# Patient Record
Sex: Female | Born: 1955 | Race: White | Hispanic: No | Marital: Single | Smoking: Never smoker
Health system: Southern US, Community
[De-identification: ages and names within clinical notes are randomized; demographics above are authoritative.]

## PROBLEM LIST (undated history)

## (undated) DIAGNOSIS — F419 Anxiety disorder, unspecified: Secondary | ICD-10-CM

## (undated) DIAGNOSIS — R05 Cough: Secondary | ICD-10-CM

## (undated) DIAGNOSIS — F909 Attention-deficit hyperactivity disorder, unspecified type: Secondary | ICD-10-CM

## (undated) DIAGNOSIS — Z9289 Personal history of other medical treatment: Secondary | ICD-10-CM

## (undated) DIAGNOSIS — J189 Pneumonia, unspecified organism: Secondary | ICD-10-CM

## (undated) DIAGNOSIS — K589 Irritable bowel syndrome without diarrhea: Secondary | ICD-10-CM

## (undated) DIAGNOSIS — R059 Cough, unspecified: Secondary | ICD-10-CM

## (undated) DIAGNOSIS — D649 Anemia, unspecified: Secondary | ICD-10-CM

## (undated) DIAGNOSIS — T839XXA Unspecified complication of genitourinary prosthetic device, implant and graft, initial encounter: Secondary | ICD-10-CM

## (undated) HISTORY — DX: Irritable bowel syndrome, unspecified: K58.9

## (undated) HISTORY — DX: Unspecified complication of genitourinary prosthetic device, implant and graft, initial encounter: T83.9XXA

## (undated) HISTORY — PX: LIPOMA EXCISION: SHX5283

## (undated) HISTORY — DX: Attention-deficit hyperactivity disorder, unspecified type: F90.9

---

## 1999-08-08 DIAGNOSIS — Z9289 Personal history of other medical treatment: Secondary | ICD-10-CM

## 1999-08-08 HISTORY — DX: Personal history of other medical treatment: Z92.89

## 2001-02-12 ENCOUNTER — Emergency Department (HOSPITAL_COMMUNITY): Admission: EM | Admit: 2001-02-12 | Discharge: 2001-02-12 | Payer: Self-pay

## 2001-03-07 ENCOUNTER — Encounter (INDEPENDENT_AMBULATORY_CARE_PROVIDER_SITE_OTHER): Payer: Self-pay | Admitting: Specialist

## 2001-03-07 ENCOUNTER — Ambulatory Visit (HOSPITAL_BASED_OUTPATIENT_CLINIC_OR_DEPARTMENT_OTHER): Admission: RE | Admit: 2001-03-07 | Discharge: 2001-03-07 | Payer: Self-pay | Admitting: Surgery

## 2001-04-30 ENCOUNTER — Ambulatory Visit (HOSPITAL_COMMUNITY): Admission: RE | Admit: 2001-04-30 | Discharge: 2001-05-01 | Payer: Self-pay | Admitting: Surgery

## 2003-07-18 ENCOUNTER — Inpatient Hospital Stay (HOSPITAL_COMMUNITY): Admission: AD | Admit: 2003-07-18 | Discharge: 2003-07-18 | Payer: Self-pay | Admitting: *Deleted

## 2003-07-30 ENCOUNTER — Encounter: Admission: RE | Admit: 2003-07-30 | Discharge: 2003-07-30 | Payer: Self-pay | Admitting: Obstetrics and Gynecology

## 2004-05-21 IMAGING — US US PELVIS COMPLETE MODIFY
1 series · 18 of 25 positions shown · non-contrast
Comparison: None.

CLINICAL DATA: Perimenopausal patient with 3 week history of brisk vaginal bleeding and pelvic pain.
TRANSABDOMINAL AND TRANSVAGINAL PELVIC ULTRASOUND, 07/18/03

[Series 1: us transvaginal non-ob · 18 of 34 slices shown]
[im 1/34]
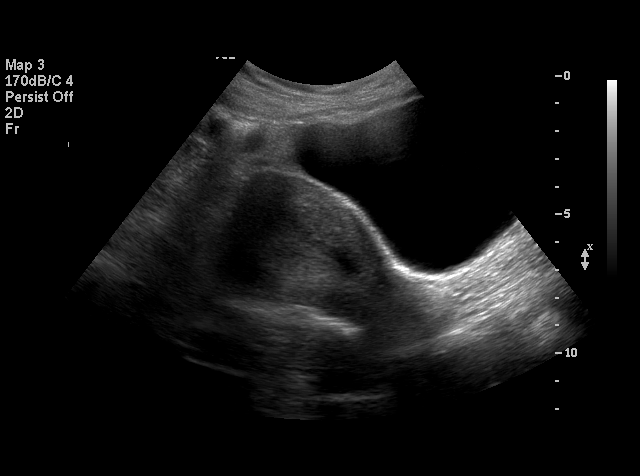
[im 3/34]
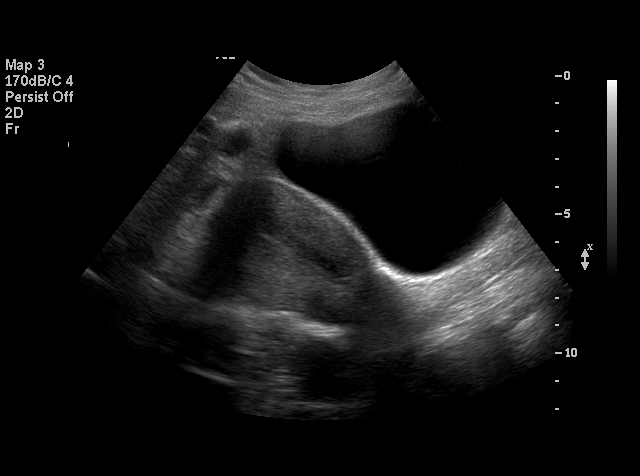
[im 5/34]
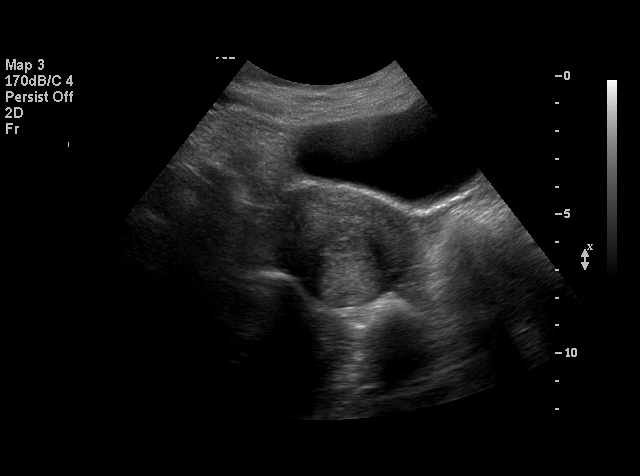
[im 6/34]
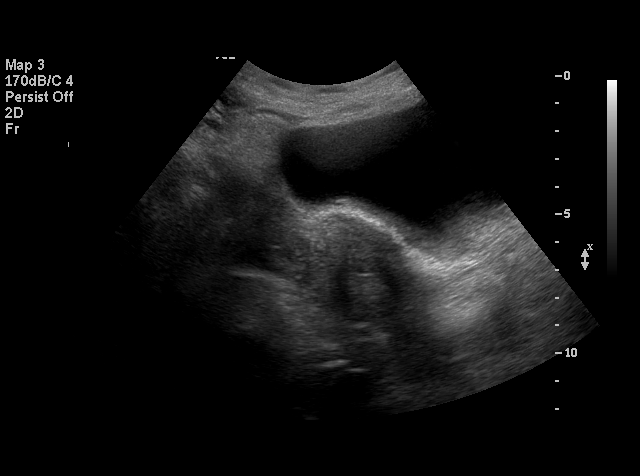
[im 9/34]
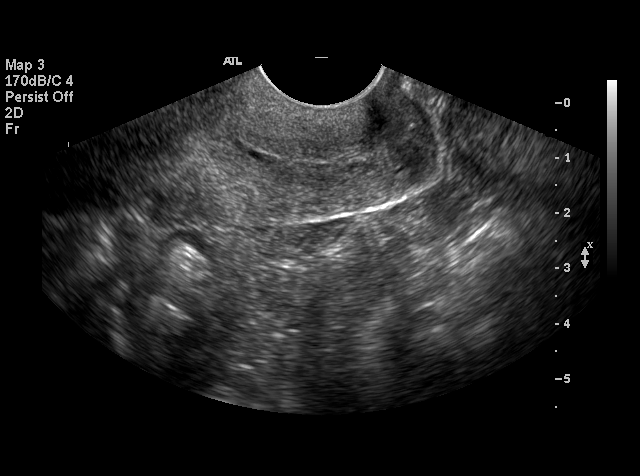
[im 10/34]
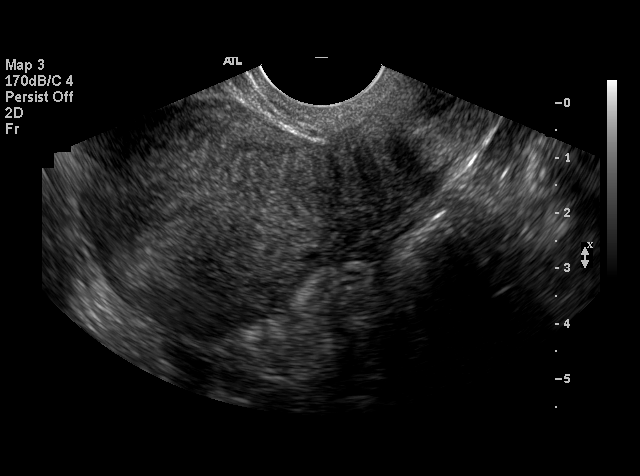
[im 13/34]
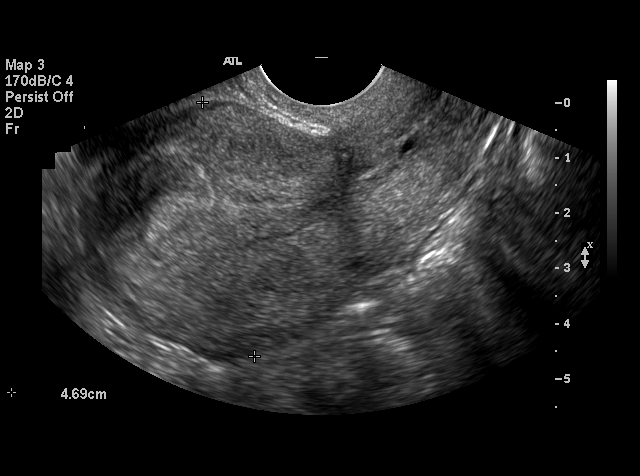
[im 14/34]
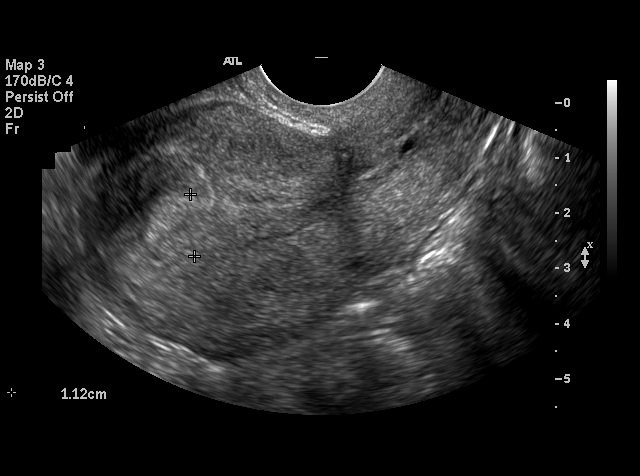
[im 16/34]
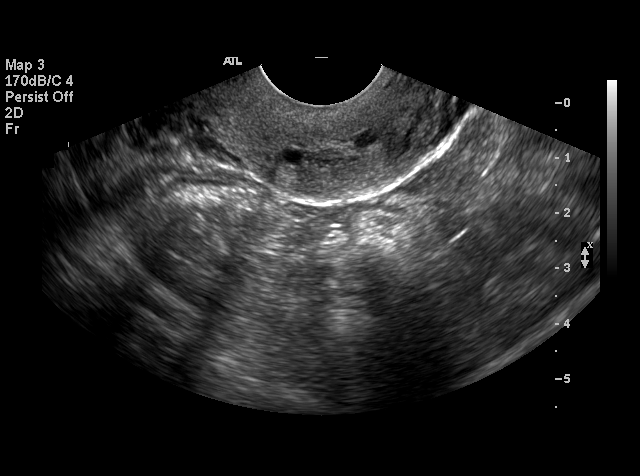
[im 18/34]
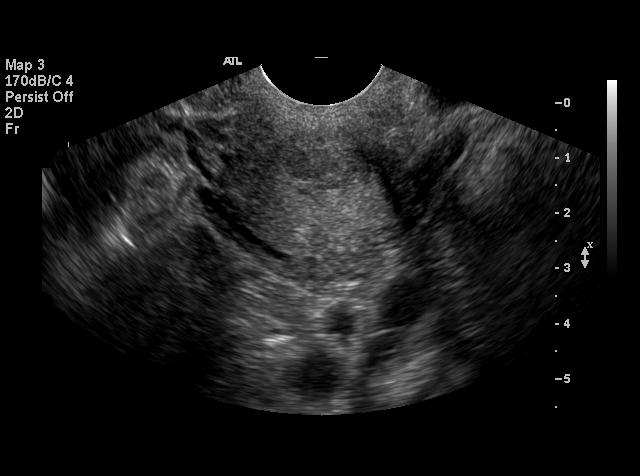
[im 20/34]
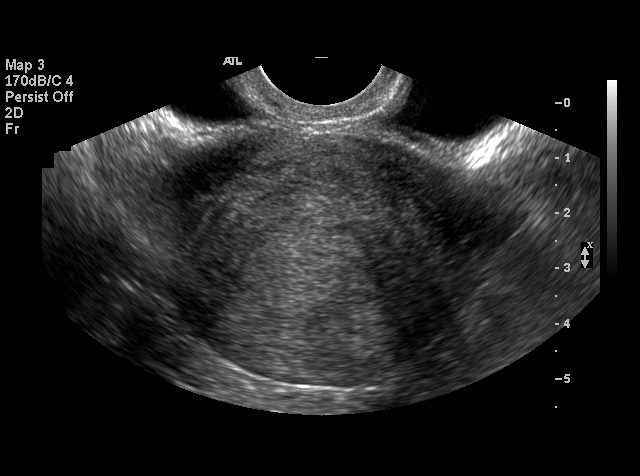
[im 21/34]
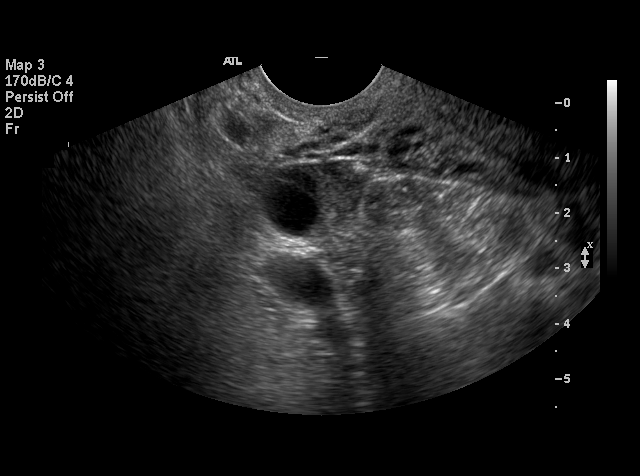
[im 24/34]
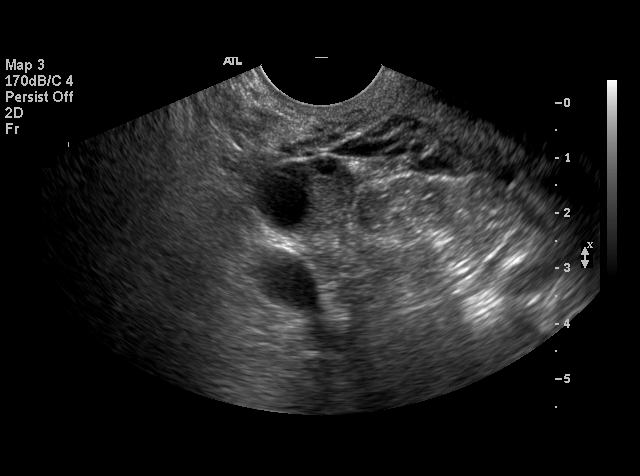
[im 25/34]
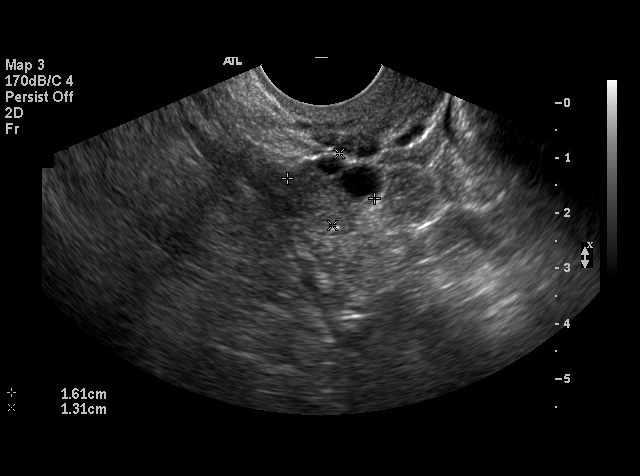
[im 28/34]
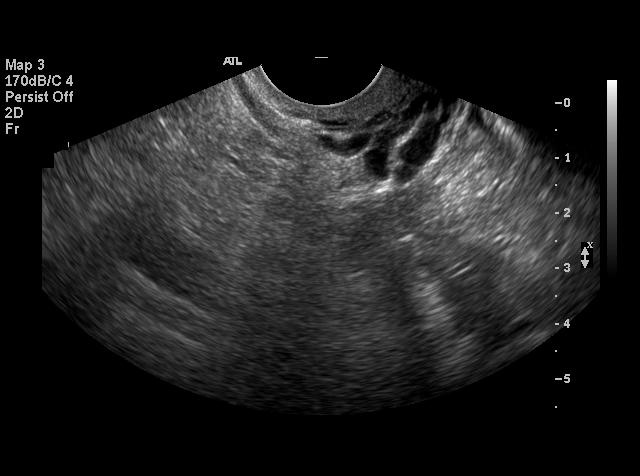
[im 29/34]
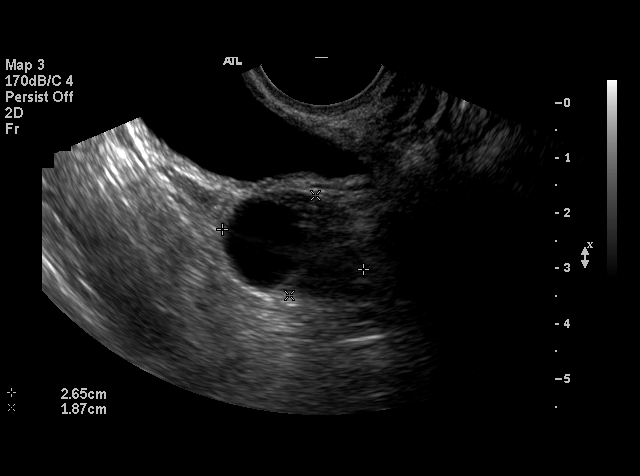
[im 31/34]
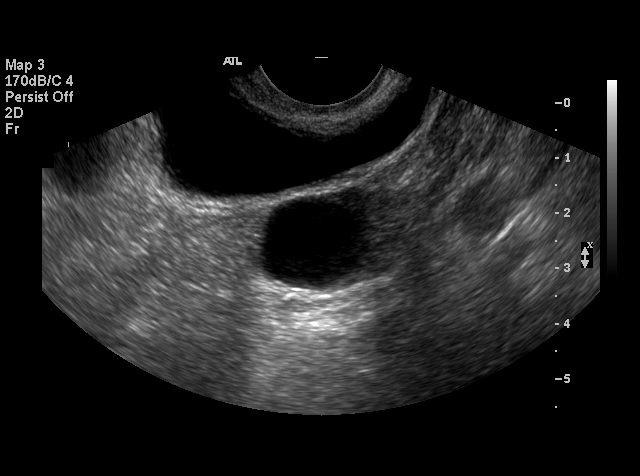
[im 34/34]
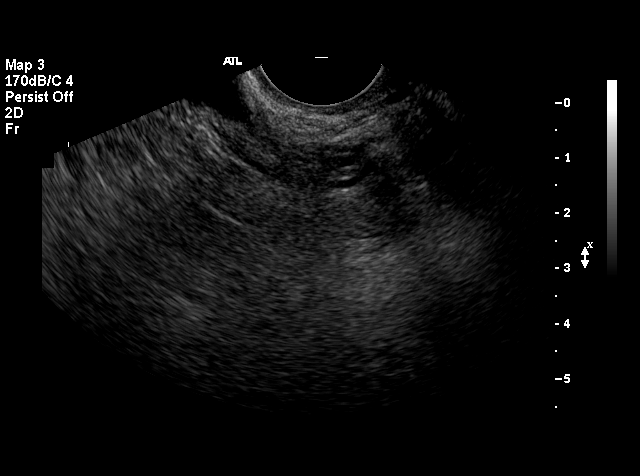

[18 of 25 positions shown; findings below may reference images not displayed]

Initially, transabdominal imaging was performed using the bladder as an acoustic window.  This shows a normal appearance to the uterus.  Thickening of the endometrium is noted.  No adnexal masses or free fluid were identified.
Subsequently, the bladder was emptied and transvaginal imaging was performed.  This confirms a normal-sized uterus which measures approximately 8.5 x 4.7 x 5.0 cm.  The endometrium is thickened, measuring approximately 13 mm.  No discrete endometrial polyps or masses are identified.  There are no fibroids and the uterine myometrium appears normal.
Both ovaries are normal in size, the right measuring approximately 1.8 x 1.6 x 1.3 cm.  The right ovary contains several small follicular cysts.  The left ovary measures approximately 2.7 x 1.9 x 2.6 cm and contains an approximate 1.7 cm simple cyst.  No adnexal masses or free fluid were identified.
IMPRESSION 
Thickened endometrium measuring 13 mm.  No discrete endometrial polyps or masses are identified.
Normal-appearing myometrium with no evidence of uterine fibroids.
1.7 cm simple cyst left ovary.  Small follicular cyst right ovary.
[REDACTED]

## 2004-08-07 DIAGNOSIS — J189 Pneumonia, unspecified organism: Secondary | ICD-10-CM

## 2004-08-07 HISTORY — DX: Pneumonia, unspecified organism: J18.9

## 2010-08-28 ENCOUNTER — Encounter: Payer: Self-pay | Admitting: Family Medicine

## 2010-12-23 NOTE — Group Therapy Note (Signed)
NAME:  Dorothy Harrison, Dorothy Harrison                             ACCOUNT NO.:  [id number]   MEDICAL RECORD NO.:  [id number]                   PATIENT TYPE:  OUT   LOCATION:  [facility]                           FACILITY:  [facility]   PHYSICIAN:  Argentina Donovan, MD                     DATE OF BIRTH:  27-Sep-1955   DATE OF SERVICE:  07/30/2003                                    CLINIC NOTE   REASON FOR VISIT:  The patient is a 55 year old gravida 4, para 4-0-0-4 who  went into the MAU because she had been bleeding for 21 days.  She was put on  oral contraceptives which has controlled the bleeding completely using Ovcon-  35.  She had a hemoglobin 9.1, hematocrit 26.7 at that time.  She has had no  further bleeding.  We have encouraged her to continue the pills for another  several months until her blood count gets back up to normal and then if she  would like to stop that should be fine.  She had a sonogram.  It was her  first bleeding episode such as that except for regular periods and the  sonogram showed a thickening endometrium, otherwise completely normal.  She  is a nonsmoker and has a normal blood pressure and weighs only 133 pounds.   IMPRESSION:  Dysfunctional uterine bleeding with secondary anemia.                                               Argentina Donovan, MD    PR/MEDQ  D:  07/30/2003  T:  07/30/2003  Job:  [id number]

## 2010-12-23 NOTE — Op Note (Signed)
[place]. [facility]  Patient:    Dorothy Harrison, Dorothy Harrison                            MRN: [id number] Proc. Date: 03/07/01 Adm. Date:  [id number] Disc. Date: [id number] Attending:  Earline Mayotte R                           Operative Report  PREOPERATIVE DIAGNOSIS:  A 2 cm scalp lesion.  POSTOPERATIVE DIAGNOSIS:  A 2 cm scalp lesion.  OPERATION PERFORMED:  Excision of scalp lesion.  SURGEON:  Abigail Miyamoto, M.D.  ANESTHESIA:  General and 0.25% Marcaine with epinephrine.  ESTIMATED BLOOD LOSS:  Minimal.  DESCRIPTION OF PROCEDURE:  The patient was brought to the operating room and identified as Dorothy Harrison.  She was placed supine on the operating table and general anesthesia was induced.  Her head was then prepped and draped in the usual sterile fashion.  A 2 cm scalp lesion on the top of her head was identified.  An elliptical incision was then created to completely excise this lesion.  Incision was carried down to the skull with the electrocautery.  The entire lesion was excised circumferentially and sent to pathology for identification.  Hemostasis was then achieved with electrocautery.  Skin flaps were then created on each side of the excision.  The subcutaneous layer was then reapproximated with interrupted 2-0 Vicryl sutures.  The skin was then closed with skin staples.  The patient tolerated the procedure well.  Sponge, needle and instrument counts were correct at the end of the procedure.  The patient was then extubated in the operating room and taken in stable condition to the recovery room. DD:  03/07/01 TD:  03/07/01 Job: [id number]

## 2010-12-23 NOTE — Op Note (Signed)
[place]. [facility]  Patient:    Dorothy Harrison, Dorothy Harrison Visit Number: [id number] MRN: [id number]          Service Type: DSU Location: [phone] Attending Physician:  Shelly Rubenstein Dictated by:   Abigail Miyamoto, M.D. Proc. Date: 04/30/01 Admit Date:  04/30/2001                             Operative Report  PREOPERATIVE DIAGNOSIS:  Chronic scalp wound.  POSTOPERATIVE DIAGNOSIS:  Chronic scalp wound.  OPERATION PERFORMED:  Debridement and attempted closure of chronic scalp wound.  SURGEON:  Abigail Miyamoto, M.D.  ANESTHESIA:  General endotracheal anesthesia, 0.25% Marcaine with epinephrine.  ESTIMATED BLOOD LOSS:  Minimal.  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  She had had infection of this wound and chronic drainage ever since then.  She has had difficulty taking care of this herself and would not allow any debridement or cleaning of the wound in the office.  She has not been cleaning it herself.  Therefore decision was made to examine the wound under anesthesia and potentially debride it.  FINDINGS:  The patient was found to have a large area of granulation tissue where the wound had broken down with an area of exposed scalp.  DESCRIPTION OF PROCEDURE:  Patient brought to operating room and identified as Dorothy Harrison.  She was placed supine on the operating table and general anesthesia was induced.  Her head was then prepped and draped in the usual sterile fashion.  The matted dried hair and eschar were then pulled off of the chronic wound.  The patient had a large 5 x 8 cm area of granulation tissue with also exposed skull.  At this point decision was made to widely excise this.  This was then in elliptical fashion with a #10 scalpel.  Incision was carried down to the skull.  At this point attempts were made to mobile  the scalp.  This was done as far anterior as the forehead and as far posterior as the back of the head.  Despite this I was unable to completely close the wound.  The wound was closed with horizontal mattress 0 Prolene sutures; however, there was still a quarter sized area of exposed skull that could not be closed over.  At this point a decision was made to end the procedure.  The wound was then covered with a Xeroform gauze and then dry gauze.  The patient tolerated the procedure well.  All sponge, needle and instrument counts were correct at the end of the procedure.  The patient was then extubated in the operating room and taken in stable condition to the recovery room. Dictated by:   Abigail Miyamoto, M.D. Attending Physician:  Shelly Rubenstein DD:  04/30/01 TD:  04/30/01 Job: [id number]

## 2011-12-23 ENCOUNTER — Other Ambulatory Visit: Payer: Self-pay | Admitting: Physician Assistant

## 2011-12-24 ENCOUNTER — Other Ambulatory Visit: Payer: Self-pay | Admitting: Physician Assistant

## 2012-01-11 ENCOUNTER — Other Ambulatory Visit: Payer: Self-pay

## 2012-02-06 ENCOUNTER — Encounter: Payer: Self-pay | Admitting: Internal Medicine

## 2012-02-28 ENCOUNTER — Encounter (HOSPITAL_COMMUNITY): Payer: Self-pay | Admitting: Pharmacy Technician

## 2012-02-29 ENCOUNTER — Encounter (HOSPITAL_COMMUNITY): Payer: Self-pay | Admitting: *Deleted

## 2012-03-01 ENCOUNTER — Encounter (HOSPITAL_COMMUNITY): Payer: Self-pay | Admitting: Anesthesiology

## 2012-03-01 ENCOUNTER — Encounter (HOSPITAL_COMMUNITY)
Admission: RE | Disposition: A | Discharge status: Home / Self Care | Payer: Self-pay | Source: Ambulatory Visit | Attending: Obstetrics and Gynecology

## 2012-03-01 ENCOUNTER — Ambulatory Visit (HOSPITAL_COMMUNITY)
Admission: RE | Admit: 2012-03-01 | Discharge: 2012-03-01 | Disposition: A | Discharge status: Home / Self Care | Payer: 59 | Source: Ambulatory Visit | Attending: Obstetrics and Gynecology | Admitting: Obstetrics and Gynecology

## 2012-03-01 DIAGNOSIS — Z538 Procedure and treatment not carried out for other reasons: Secondary | ICD-10-CM | POA: Insufficient documentation

## 2012-03-01 HISTORY — DX: Pneumonia, unspecified organism: J18.9

## 2012-03-01 HISTORY — DX: Cough, unspecified: R05.9

## 2012-03-01 HISTORY — DX: Personal history of other medical treatment: Z92.89

## 2012-03-01 HISTORY — DX: Anemia, unspecified: D64.9

## 2012-03-01 HISTORY — DX: Anxiety disorder, unspecified: F41.9

## 2012-03-01 HISTORY — DX: Cough: R05

## 2012-03-01 SURGERY — REMOVAL, INTRAUTERINE DEVICE
Anesthesia: Choice

## 2012-03-01 MED ORDER — FENTANYL CITRATE 0.05 MG/ML IJ SOLN
INTRAMUSCULAR | Status: AC
  Filled 2012-03-01: qty 2

## 2012-03-01 MED ORDER — MIDAZOLAM HCL 2 MG/2ML IJ SOLN
INTRAMUSCULAR | Status: AC
  Filled 2012-03-01: qty 2

## 2012-03-01 MED ORDER — PROPOFOL 10 MG/ML IV EMUL
INTRAVENOUS | Status: AC
  Filled 2012-03-01: qty 20

## 2012-03-01 MED ORDER — DEXAMETHASONE SODIUM PHOSPHATE 10 MG/ML IJ SOLN
INTRAMUSCULAR | Status: AC
  Filled 2012-03-01: qty 1

## 2012-03-01 MED ORDER — ONDANSETRON HCL 4 MG/2ML IJ SOLN
INTRAMUSCULAR | Status: AC
  Filled 2012-03-01: qty 2

## 2012-03-01 MED ORDER — LIDOCAINE HCL (CARDIAC) 20 MG/ML IV SOLN
INTRAVENOUS | Status: AC
  Filled 2012-03-01: qty 5

## 2012-03-01 SURGICAL SUPPLY — 12 items
CATH ROBINSON RED A/P 16FR (CATHETERS) IMPLANT
CLOTH BEACON ORANGE TIMEOUT ST (SAFETY) IMPLANT
CONTAINER PREFILL 10% NBF 60ML (FORM) IMPLANT
GLOVE BIO SURGEON STRL SZ7 (GLOVE) IMPLANT
GOWN PREVENTION PLUS LG XLONG (DISPOSABLE) IMPLANT
GOWN STRL REIN XL XLG (GOWN DISPOSABLE) IMPLANT
NEEDLE SPNL 22GX3.5 QUINCKE BK (NEEDLE) IMPLANT
PACK VAGINAL MINOR WOMEN LF (CUSTOM PROCEDURE TRAY) IMPLANT
PAD PREP 24X48 CUFFED NSTRL (MISCELLANEOUS) IMPLANT
SYR CONTROL 10ML LL (SYRINGE) IMPLANT
TOWEL OR 17X24 6PK STRL BLUE (TOWEL DISPOSABLE) IMPLANT
WATER STERILE IRR 1000ML POUR (IV SOLUTION) IMPLANT

## 2012-03-01 NOTE — Progress Notes (Signed)
Surgery cancelled.  Pt. D/c to home with family.  Understands instructions to reschedule procedure with Dr. Waynard Reeds.

## 2012-03-04 ENCOUNTER — Encounter (HOSPITAL_COMMUNITY): Payer: Self-pay | Admitting: Obstetrics and Gynecology

## 2013-06-10 ENCOUNTER — Ambulatory Visit: Payer: Self-pay | Admitting: Women's Health

## 2013-06-13 ENCOUNTER — Ambulatory Visit: Payer: Self-pay | Admitting: Women's Health

## 2013-06-19 ENCOUNTER — Encounter: Payer: Self-pay | Admitting: Women's Health

## 2013-06-19 ENCOUNTER — Ambulatory Visit (INDEPENDENT_AMBULATORY_CARE_PROVIDER_SITE_OTHER): Payer: BC Managed Care – PPO | Admitting: Women's Health

## 2013-06-19 VITALS — BP 118/70 | Ht 66.0 in | Wt 162.2 lb

## 2013-06-19 DIAGNOSIS — B9689 Other specified bacterial agents as the cause of diseases classified elsewhere: Secondary | ICD-10-CM

## 2013-06-19 DIAGNOSIS — T839XXA Unspecified complication of genitourinary prosthetic device, implant and graft, initial encounter: Secondary | ICD-10-CM | POA: Insufficient documentation

## 2013-06-19 DIAGNOSIS — N898 Other specified noninflammatory disorders of vagina: Secondary | ICD-10-CM

## 2013-06-19 DIAGNOSIS — N760 Acute vaginitis: Secondary | ICD-10-CM

## 2013-06-19 DIAGNOSIS — A499 Bacterial infection, unspecified: Secondary | ICD-10-CM

## 2013-06-19 LAB — WET PREP FOR TRICH, YEAST, CLUE
Trich, Wet Prep: NONE SEEN
Yeast Wet Prep HPF POC: NONE SEEN

## 2013-06-19 MED ORDER — METRONIDAZOLE 0.75 % VA GEL: VAGINAL | Status: DC

## 2013-06-19 NOTE — Addendum Note (Signed)
Addended by: Richardson Chiquito on: 06/19/2013 02:54 PM   Modules accepted: Orders

## 2013-06-19 NOTE — Progress Notes (Addendum)
Patient ID: Dorothy Harrison, female   DOB: Jul 04, 1956, 57 y.o.   MRN: [id number] Referral from [facility] [place] [facility].  Presents with complaint of vaginal discharge, persistant green discharge was negative STD screen and wet prep.  Mirena IUD for 6 years, embedded unable to be retrieved on several occasions. Was scheduled for a hysteroscopic removal but had  upper respiratory infection. 06/05/2013 Vaginal discharge  treated with Rocephin, azithromycin and tindazole without relief. Normal Pap and mammogram history. Negative breast biopsy in the past benign cyst. History of IBS. Normal DEXA.  Exam: Appears well, external genitalia within normal limits, speculum exam IUD strings visible, ring forcep unable to remove, moderate amount of a thin green discharge noted. Wet prep positive for clues, amines, TNTC bacteria. Bimanual no CMT or adnexal fullness or tenderness.  Bacteria vaginosis Persistent discharge Embedded Mirena IUD  Plan: MetroGel vaginal cream 1 applicator at bedtime x5, then weekly for several weeks, then monthly if needed. Alcohol precautions were reviewed. Will schedule appointment to discuss removal of IUD with Dr. Audie Box.

## 2013-06-19 NOTE — Patient Instructions (Signed)
Vit D 2000 daily Fish oil daily Calcium rich foods daily Health Recommendations for Postmenopausal Women Respected and ongoing research has looked at the most common causes of death, disability, and poor quality of life in postmenopausal women. The causes include heart disease, diseases of blood vessels, diabetes, depression, cancer, and bone loss (osteoporosis). Many things can be done to help lower the chances of developing these and other common problems: CARDIOVASCULAR DISEASE Heart Disease: A heart attack is a medical emergency. Know the signs and symptoms of a heart attack. Below are things women can do to reduce their risk for heart disease.   Do not smoke. If you smoke, quit.  Aim for a healthy weight. Being overweight causes many preventable deaths. Eat a healthy and balanced diet and drink an adequate amount of liquids.  Get moving. Make a commitment to be more physically active. Aim for 30 minutes of activity on most, if not all days of the week.  Eat for heart health. Choose a diet that is low in saturated fat and cholesterol and eliminate trans fat. Include whole grains, vegetables, and fruits. Read and understand the labels on food containers before buying.  Know your numbers. Ask your caregiver to check your blood pressure, cholesterol (total, HDL, LDL, triglycerides) and blood glucose. Work with your caregiver on improving your entire clinical picture.  High blood pressure. Limit or stop your table salt intake (try salt substitute and food seasonings). Avoid salty foods and drinks. Read labels on food containers before buying. Eating well and exercising can help control high blood pressure. STROKE  Stroke is a medical emergency. Stroke may be the result of a blood clot in a blood vessel in the brain or by a brain hemorrhage (bleeding). Know the signs and symptoms of a stroke. To lower the risk of developing a stroke:  Avoid fatty foods.  Quit smoking.  Control your diabetes,  blood pressure, and irregular heart rate. THROMBOPHLEBITIS (BLOOD CLOT) OF THE LEG  Becoming overweight and leading a stationary lifestyle may also contribute to developing blood clots. Controlling your diet and exercising will help lower the risk of developing blood clots. CANCER SCREENING  Breast Cancer: Take steps to reduce your risk of breast cancer.  You should practice "breast self-awareness." This means understanding the normal appearance and feel of your breasts and should include breast self-examination. Any changes detected, no matter how small, should be reported to your caregiver.  After age 53, you should have a clinical breast exam (CBE) every year.  Starting at age 26, you should consider having a mammogram (breast X-ray) every year.  If you have a family history of breast cancer, talk to your caregiver about genetic screening.  If you are at high risk for breast cancer, talk to your caregiver about having an MRI and a mammogram every year.  Intestinal or Stomach Cancer: Tests to consider are a rectal exam, fecal occult blood, sigmoidoscopy, and colonoscopy. Women who are high risk may need to be screened at an earlier age and more often.  Cervical Cancer:  Beginning at age 97, you should have a Pap test every 3 years as long as the past 3 Pap tests have been normal.  If you have had past treatment for cervical cancer or a condition that could lead to cancer, you need Pap tests and screening for cancer for at least 20 years after your treatment.  If you had a hysterectomy for a problem that was not cancer or a condition that could  lead to cancer, then you no longer need Pap tests.  If you are between ages 58 and 77, and you have had normal Pap tests going back 10 years, you no longer need Pap tests.  If Pap tests have been discontinued, risk factors (such as a new sexual partner) need to be reassessed to determine if screening should be resumed.  Some medical problems  can increase the chance of getting cervical cancer. In these cases, your caregiver may recommend more frequent screening and Pap tests.  Uterine Cancer: If you have vaginal bleeding after reaching menopause, you should notify your caregiver.  Ovarian cancer: Other than yearly pelvic exams, there are no reliable tests available to screen for ovarian cancer at this time except for yearly pelvic exams.  Lung Cancer: Yearly chest X-rays can detect lung cancer and should be done on high risk women, such as cigarette smokers and women with chronic lung disease (emphysema).  Skin Cancer: A complete body skin exam should be done at your yearly examination. Avoid overexposure to the sun and ultraviolet light lamps. Use a strong sun block cream when in the sun. All of these things are important in lowering the risk of skin cancer. MENOPAUSE Menopause Symptoms: Hormone therapy products are effective for treating symptoms associated with menopause:  Moderate to severe hot flashes.  Night sweats.  Mood swings.  Headaches.  Tiredness.  Loss of sex drive.  Insomnia.  Other symptoms. Hormone replacement carries certain risks, especially in older women. Women who use or are thinking about using estrogen or estrogen with progestin treatments should discuss that with their caregiver. Your caregiver will help you understand the benefits and risks. The ideal dose of hormone replacement therapy is not known. The Food and Drug Administration (FDA) has concluded that hormone therapy should be used only at the lowest doses and for the shortest amount of time to reach treatment goals.  OSTEOPOROSIS Protecting Against Bone Loss and Preventing Fracture: If you use hormone therapy for prevention of bone loss (osteoporosis), the risks for bone loss must outweigh the risk of the therapy. Ask your caregiver about other medications known to be safe and effective for preventing bone loss and fractures. To guard against  bone loss or fractures, the following is recommended:  If you are less than age 28, take 1000 mg of calcium and at least 600 mg of Vitamin D per day.  If you are greater than age 19 but less than age 23, take 1200 mg of calcium and at least 600 mg of Vitamin D per day.  If you are greater than age 35, take 1200 mg of calcium and at least 800 mg of Vitamin D per day. Smoking and excessive alcohol intake increases the risk of osteoporosis. Eat foods rich in calcium and vitamin D and do weight bearing exercises several times a week as your caregiver suggests. DIABETES Diabetes Melitus: If you have Type I or Type 2 diabetes, you should keep your blood sugar under control with diet, exercise and recommended medication. Avoid too many sweets, starchy and fatty foods. Being overweight can make control more difficult. COGNITION AND MEMORY Cognition and Memory: Menopausal hormone therapy is not recommended for the prevention of cognitive disorders such as Alzheimer's disease or memory loss.  DEPRESSION  Depression may occur at any age, but is common in elderly women. The reasons may be because of physical, medical, social (loneliness), or financial problems and needs. If you are experiencing depression because of medical problems and control  of symptoms, talk to your caregiver about this. Physical activity and exercise may help with mood and sleep. Community and volunteer involvement may help your sense of value and worth. If you have depression and you feel that the problem is getting worse or becoming severe, talk to your caregiver about treatment options that are best for you. ACCIDENTS  Accidents are common and can be serious in the elderly woman. Prepare your house to prevent accidents. Eliminate throw rugs, place hand bars in the bath, shower and toilet areas. Avoid wearing high heeled shoes or walking on wet, snowy, and icy areas. Limit or stop driving if you have vision or hearing problems, or you feel  you are unsteady with you movements and reflexes. HEPATITIS C Hepatitis C is a type of viral infection affecting the liver. It is spread mainly through contact with blood from an infected person. It can be treated, but if left untreated, it can lead to severe liver damage over years. Many people who are infected do not know that the virus is in their blood. If you are a "baby-boomer", it is recommended that you have one screening test for Hepatitis C. IMMUNIZATIONS  Several immunizations are important to consider having during your senior years, including:   Tetanus, diptheria, and pertussis booster shot.  Influenza every year before the flu season begins.  Pneumonia vaccine.  Shingles vaccine.  Others as indicated based on your specific needs. Talk to your caregiver about these. Document Released: 09/15/2005 Document Revised: 07/10/2012 Document Reviewed: 05/11/2008 [employer] [facility] [employer] Patient Information 2014 [place], [place].

## 2013-06-30 ENCOUNTER — Ambulatory Visit (INDEPENDENT_AMBULATORY_CARE_PROVIDER_SITE_OTHER): Payer: BC Managed Care – PPO | Admitting: Licensed Clinical Social Worker

## 2013-06-30 DIAGNOSIS — F321 Major depressive disorder, single episode, moderate: Secondary | ICD-10-CM

## 2013-07-07 DIAGNOSIS — T839XXA Unspecified complication of genitourinary prosthetic device, implant and graft, initial encounter: Secondary | ICD-10-CM

## 2013-07-07 HISTORY — DX: Unspecified complication of genitourinary prosthetic device, implant and graft, initial encounter: T83.9XXA

## 2013-07-08 ENCOUNTER — Encounter: Payer: Self-pay | Admitting: Gynecology

## 2013-07-08 ENCOUNTER — Ambulatory Visit (INDEPENDENT_AMBULATORY_CARE_PROVIDER_SITE_OTHER): Payer: BC Managed Care – PPO | Admitting: Gynecology

## 2013-07-08 ENCOUNTER — Telehealth: Payer: Self-pay | Admitting: *Deleted

## 2013-07-08 DIAGNOSIS — N898 Other specified noninflammatory disorders of vagina: Secondary | ICD-10-CM

## 2013-07-08 DIAGNOSIS — T8389XA Other specified complication of genitourinary prosthetic devices, implants and grafts, initial encounter: Secondary | ICD-10-CM

## 2013-07-08 DIAGNOSIS — B373 Candidiasis of vulva and vagina: Secondary | ICD-10-CM

## 2013-07-08 LAB — WET PREP FOR TRICH, YEAST, CLUE: Trich, Wet Prep: NONE SEEN

## 2013-07-08 MED ORDER — FLUCONAZOLE 200 MG PO TABS: 200.0000 mg | ORAL_TABLET | Freq: Every day | ORAL | Status: DC

## 2013-07-08 NOTE — Patient Instructions (Signed)
Office will call you to arrange ultrasound. Take yeast pills for 5 days. Followup if symptoms persist, worsen or recur.

## 2013-07-08 NOTE — Telephone Encounter (Signed)
Message copied by Aura Camps on Tue Jul 08, 2013 11:45 AM ------      Message from: Dara Lords      Created: Tue Jul 08, 2013 10:32 AM       Arrange for 3-D ultrasound at the hospital reference embedded IUD. Define location. Let me know if there is any issue arranging this. ------

## 2013-07-08 NOTE — Progress Notes (Signed)
Patient presents in followup having recently seen Dorothy Harrison. She was treated for bacterial vaginosis. Attempted to have her Mirena IUD removed but this was unsuccessful and she was referred to me. Historically IUD is present for 6 years. Had no issues with the IUD. Is amenorrheic.  Previously had Dr. Waynard Harrison attempted removal which sounds like she had a paracervical block done but was unsuccessful. Also had her primary physician Dorothy Harrison tried to remove it and was unsuccessful. She was scheduled to go to the operating room but was canceled due to pneumonia. She's not having pain or any other symptoms other than the recurrent vaginal discharge that did not result with MetroGel prescribed by Dorothy Harrison.  Exam with Dorothy Harrison assistant Dorothy Harrison without tenderness masses guarding rebound organomegaly Pelvic external BUS vagina with thick white discharge. Cervix normal. IUD string visualized. Uterus normal size midline mobile Harrison. Adnexa without masses or tenderness.  Assessment and plan: 1. Persistent thick discharge. Wet prep positive for yeast. Will treat with Diflucan 200 mg daily x5 days. Followup if symptoms persist worsen or recur. 2. Embedded IUD historically. Offered to try today to remove it she refused. Issues discussed as to whether it needs to be removed regardless recognizing that she does not need the contraceptive efficacy. Also discussing upper and labeling as far as leaving it in place. Do not think it is the source of her vaginitis. If we are going to remove it then patient wants to have this done under anesthesia. Which involved with the procedure and risks to include being unable to remove it regardless of being in the operating room, fragmenting or breaking the IUD leaving some pieces in situ, uterine perforation, damage to internal organs necessitating matrix portrait reparative surgeries up to and including hysterectomy. Bleeding and infection following the procedure also  reviewed. Does not appear that any studies were done to better define the IUD location. We'll arrange for 3-D ultrasound to give [place] a better idea of placement and whether it is indeed embedded within the myometrium. We'll then further discuss with the patient her options after these results.

## 2013-07-08 NOTE — Addendum Note (Signed)
Addended by: Dayna Barker on: 07/08/2013 10:38 AM   Modules accepted: Orders

## 2013-07-08 NOTE — Telephone Encounter (Signed)
Called scheduling and told to place the above orders. Appointment scheduled on 07/15/13 @ 9:00 am at [facility] hospital pt informed to come with full bladder.

## 2013-07-15 ENCOUNTER — Ambulatory Visit (HOSPITAL_COMMUNITY)
Admission: RE | Admit: 2013-07-15 | Discharge: 2013-07-15 | Disposition: A | Discharge status: Home / Self Care | Payer: BC Managed Care – PPO | Source: Ambulatory Visit | Attending: Gynecology | Admitting: Gynecology

## 2013-07-15 DIAGNOSIS — T8389XA Other specified complication of genitourinary prosthetic devices, implants and grafts, initial encounter: Secondary | ICD-10-CM | POA: Insufficient documentation

## 2013-07-16 ENCOUNTER — Ambulatory Visit: Payer: BC Managed Care – PPO | Admitting: Licensed Clinical Social Worker

## 2013-07-21 ENCOUNTER — Encounter: Payer: Self-pay | Admitting: Gynecology

## 2013-07-21 ENCOUNTER — Ambulatory Visit (INDEPENDENT_AMBULATORY_CARE_PROVIDER_SITE_OTHER): Payer: BC Managed Care – PPO | Admitting: Gynecology

## 2013-07-21 DIAGNOSIS — T8389XS Other specified complication of genitourinary prosthetic devices, implants and grafts, sequela: Secondary | ICD-10-CM

## 2013-07-21 DIAGNOSIS — Z30432 Encounter for removal of intrauterine contraceptive device: Secondary | ICD-10-CM

## 2013-07-21 NOTE — Patient Instructions (Signed)
Followup routinely for annual exam, sooner if any issues or problems.

## 2013-07-21 NOTE — Progress Notes (Addendum)
Patient presents in followup. Had ultrasound which showed probable embedding of the IUD to the right lower uterine segment. The pictures were suboptimal per the technician's report. Right left ovaries were not visualized readily. Patient's yeast vaginal discharge treated with Diflucan 200 mg x5 days has totally resolved and the patient currently is asymptomatic.  The unusual situation of embedded Mirena IUD in postmenopausal patient who is asymptomatic reviewed. Options to include operating room removal with its inherent risks to include anesthetic and surgical versus leaving the IUD in place and the wrist that she would develop symptoms in the future or that it would continue to embed  in or through the uterus. The patient at this point feels comfortable with observation and doing nothing but following this. I think is reasonable given that she does not need the IUD for contraception and we will continue to follow expectantly.

## 2013-07-23 ENCOUNTER — Ambulatory Visit (INDEPENDENT_AMBULATORY_CARE_PROVIDER_SITE_OTHER): Payer: BC Managed Care – PPO | Admitting: Licensed Clinical Social Worker

## 2013-07-23 DIAGNOSIS — F321 Major depressive disorder, single episode, moderate: Secondary | ICD-10-CM

## 2013-07-28 ENCOUNTER — Ambulatory Visit: Payer: BC Managed Care – PPO | Admitting: Licensed Clinical Social Worker

## 2013-08-18 ENCOUNTER — Telehealth (HOSPITAL_COMMUNITY): Payer: Self-pay

## 2014-03-31 ENCOUNTER — Encounter: Payer: Self-pay | Admitting: Internal Medicine

## 2014-05-19 IMAGING — US US PELVIS COMPLETE
1 series · 13 of 25 positions shown · non-contrast
Comparison: None

CLINICAL DATA: IUD complication, embedded IUD



[Series 1: us pelvis complete · 13 of 76 slices shown]
[im 1/76]
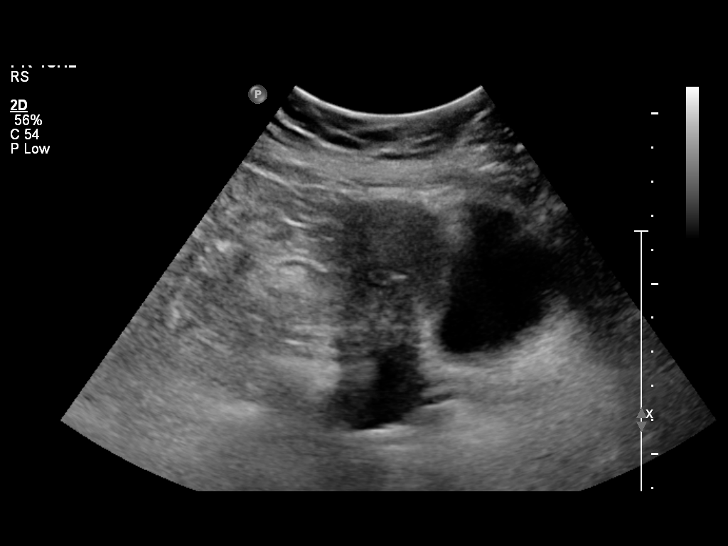
[im 7/76]
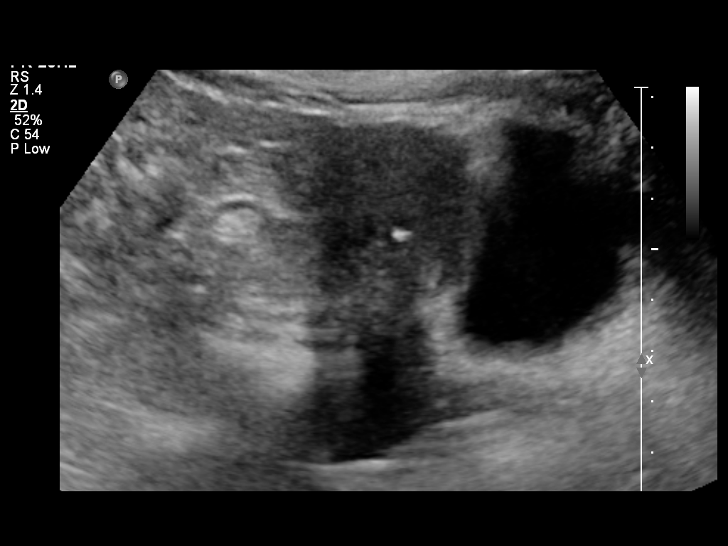
[im 13/76]
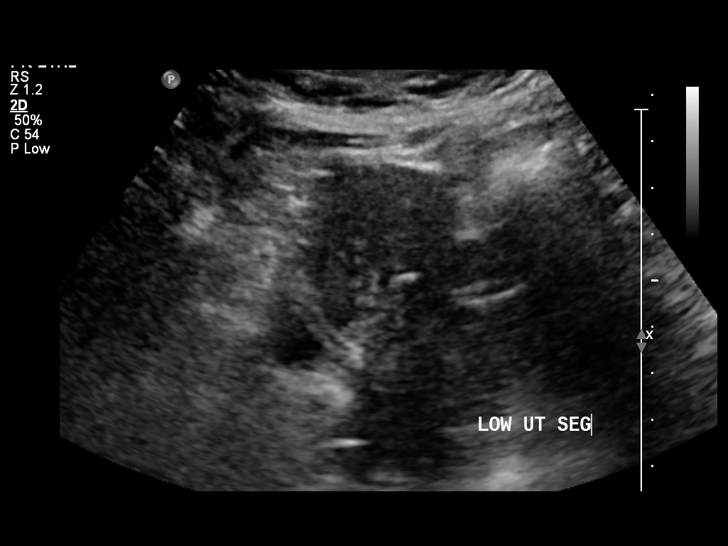
[im 19/76]
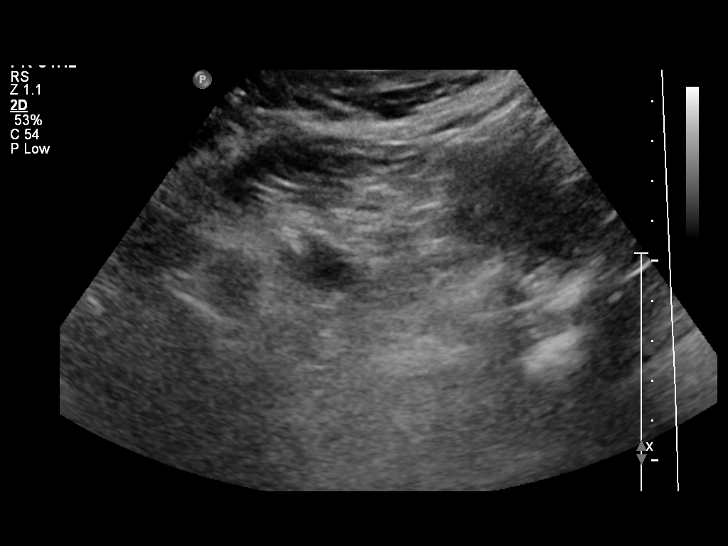
[im 26/76]
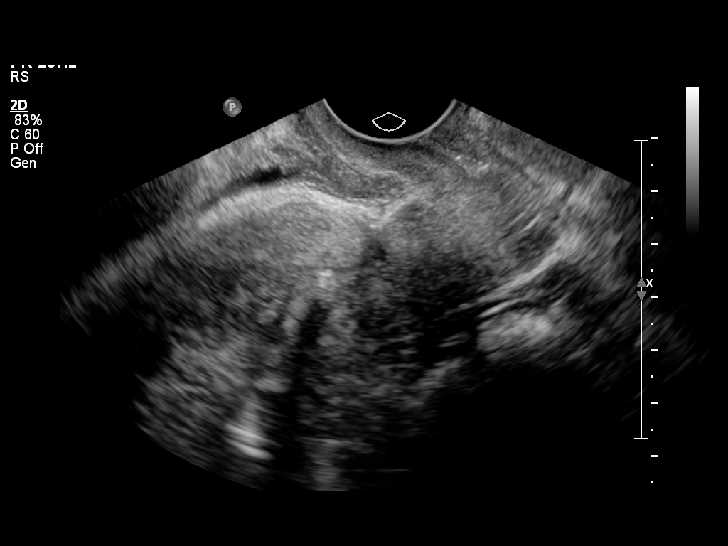
[im 32/76]
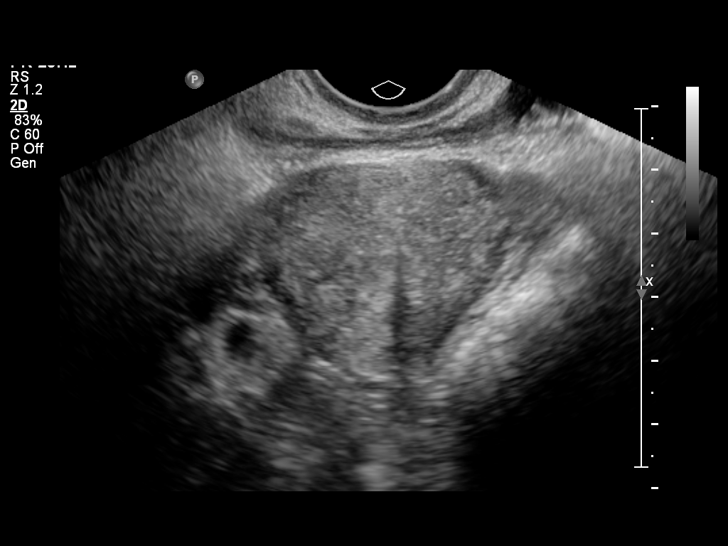
[im 38/76]
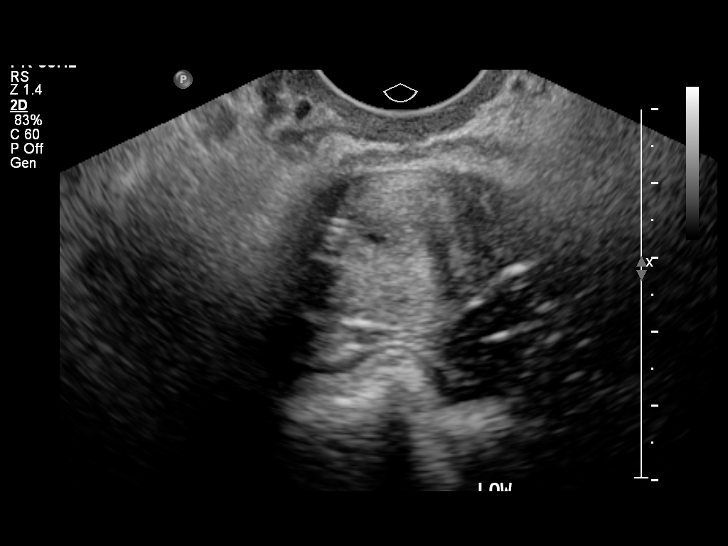
[im 44/76]
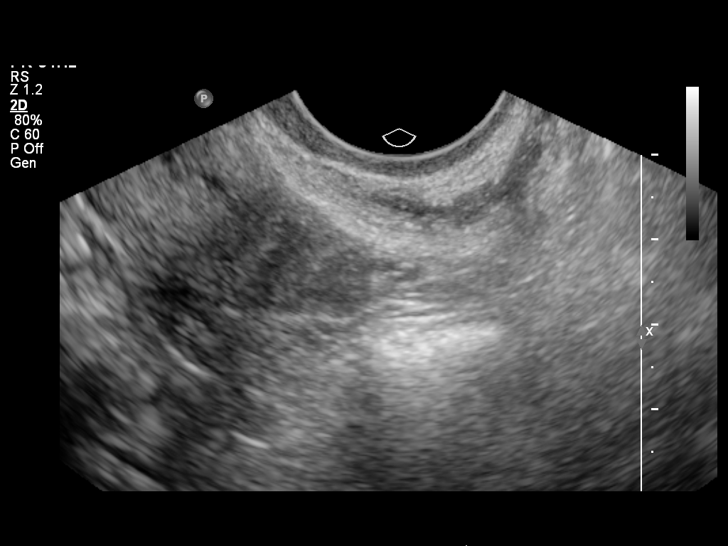
[im 51/76]
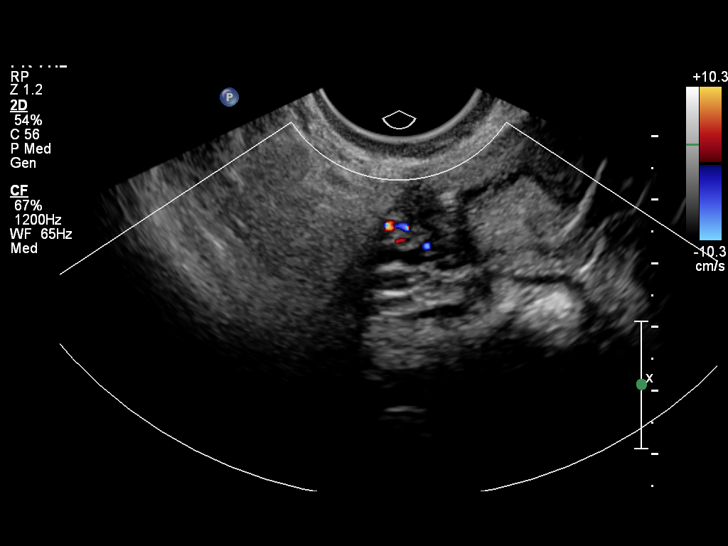
[im 57/76]
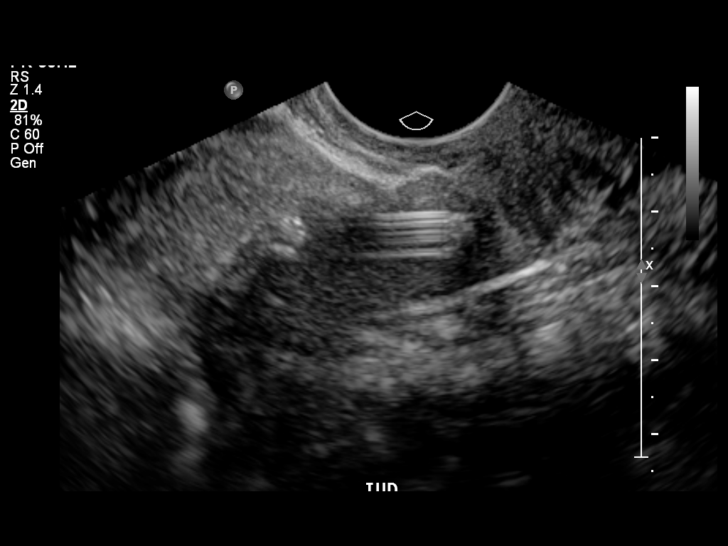
[im 63/76]
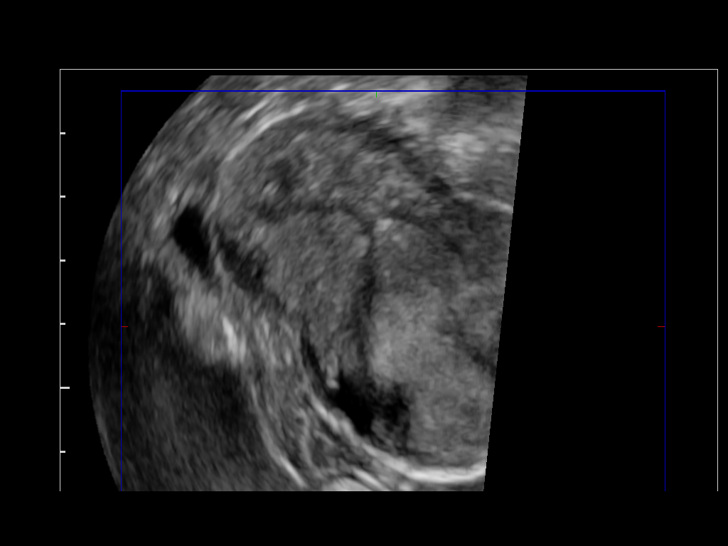
[im 69/76]
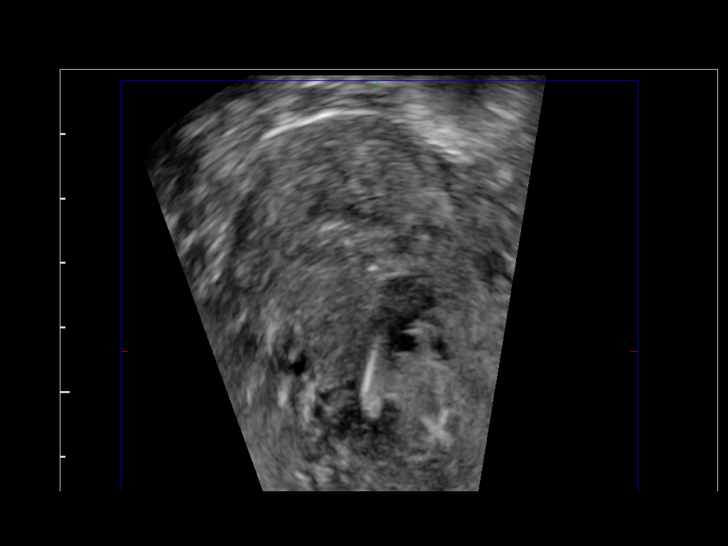
[im 76/76]
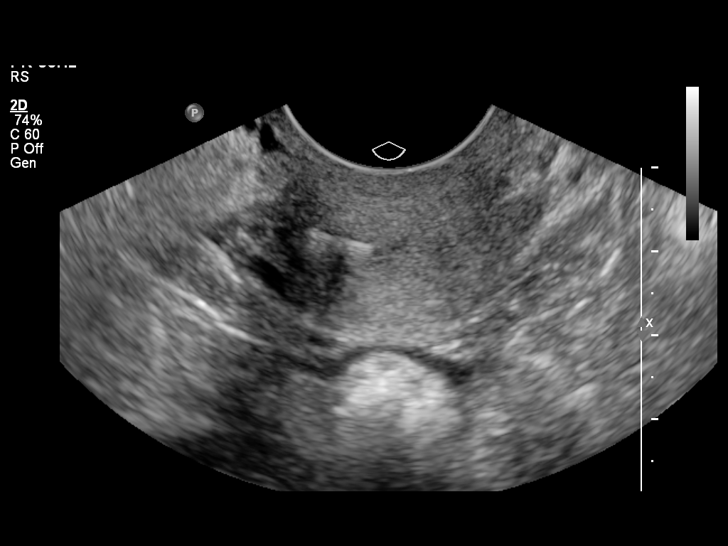

[13 of 25 positions shown; findings below may reference images not displayed]

FINDINGS: Uterus

Measurements: 7.7 x 3.3 x 3.5 cm. No fibroids or other mass
visualized.

Endometrium

Poorly visualized. IUD in place. Although suboptimally
visualized/evaluated, the vertical stem of the IUD appears embedded
within the myometrium along the right aspect of the lower uterine
segment. Arms of the IUD are poorly visualized.

Right ovary

Not discretely visualized transabdominally or transvaginally.

Left ovary

Not discretely visualized transabdominally or transvaginally.

Other findings

No free fluid.
IMPRESSION: Although suboptimally visualized, the vertical stem of the IUD
appears embedded within the myometrium along the right aspect of the
lower uterine segment.

Bilateral ovaries are not discretely visualized.

## 2014-06-08 ENCOUNTER — Encounter: Payer: Self-pay | Admitting: Gynecology

## 2014-08-20 ENCOUNTER — Encounter (HOSPITAL_COMMUNITY): Payer: Self-pay | Admitting: Obstetrics and Gynecology

## 2014-11-10 ENCOUNTER — Encounter: Payer: Self-pay | Admitting: Internal Medicine

## 2016-04-05 ENCOUNTER — Encounter: Payer: Self-pay | Admitting: Internal Medicine

## 2016-06-15 ENCOUNTER — Encounter: Payer: Self-pay | Admitting: Internal Medicine

## 2016-06-15 ENCOUNTER — Other Ambulatory Visit (INDEPENDENT_AMBULATORY_CARE_PROVIDER_SITE_OTHER): Payer: Managed Care, Other (non HMO)

## 2016-06-15 ENCOUNTER — Encounter (INDEPENDENT_AMBULATORY_CARE_PROVIDER_SITE_OTHER): Payer: Self-pay

## 2016-06-15 ENCOUNTER — Ambulatory Visit (INDEPENDENT_AMBULATORY_CARE_PROVIDER_SITE_OTHER): Payer: Managed Care, Other (non HMO) | Admitting: Internal Medicine

## 2016-06-15 VITALS — BP 110/78 | HR 64 | Ht 66.0 in | Wt 170.4 lb

## 2016-06-15 DIAGNOSIS — K625 Hemorrhage of anus and rectum: Secondary | ICD-10-CM

## 2016-06-15 DIAGNOSIS — R197 Diarrhea, unspecified: Secondary | ICD-10-CM

## 2016-06-15 DIAGNOSIS — R143 Flatulence: Secondary | ICD-10-CM

## 2016-06-15 LAB — IGA: IgA: 200 mg/dL (ref 68–378)

## 2016-06-15 MED ORDER — NA SULFATE-K SULFATE-MG SULF 17.5-3.13-1.6 GM/177ML PO SOLN: 1.0000 | Freq: Once | ORAL | 0 refills | Status: AC

## 2016-06-15 NOTE — Patient Instructions (Signed)
Your physician has requested that you go to the basement for the following lab work before leaving today:  TTG, IGA  You have been scheduled for a colonoscopy. Please follow written instructions given to you at your visit today.  Please pick up your prep supplies at the pharmacy within the next 1-3 days. If you use inhalers (even only as needed), please bring them with you on the day of your procedure. Your physician has requested that you go to www.startemmi.com and enter the access code given to you at your visit today. This web site gives a general overview about your procedure. However, you should still follow specific instructions given to you by our office regarding your preparation for the procedure.   

## 2016-06-15 NOTE — Progress Notes (Signed)
HISTORY OF PRESENT ILLNESS:  Dorothy Harrison is a 60 y.o. female, Dorothy AuerbachHarris Teeter Harrison, who is referred by her primary care provider Dorothy Harrison with chief complaints of diarrhea, rectal bleeding, and increased intestinal gas. Patient carries the diagnosis of irritable bowel syndrome. The extent of the workup unknown. She did undergo colonoscopy approximately 10 years ago with Dr. Bosie ClosSchooler. No records available. Patient tells me that she has had a 20 year history of near daily diarrhea. There is associated abdominal cramping and urgency. May go up to one week without problems, but no longer. About 2 months ago she had significant blood per rectum with defecation on one occasion. No associated rectal pain. She reports constant problems with increased flatus and occasional bloating. She self medicates with Imodium. Generally takes 9-12 daily. Denies having been offered other therapies for her problem. Denies using sugar-free entities. No prior abdominal surgery. She does have anxiety for which she is on Paxil. She also has Ativan. Outside office note, laboratories, and x-rays reviewed. The patient was evaluated 04/04/2016. Noncontrast CT scan of the abdomen and pelvis was unremarkable. Urinalysis revealed moderate blood. CBC was unremarkable. She was started on Cipro for her abnormal urine. Referred to GI for rectal bleeding and abdominal complaints. No upper GI complaints  REVIEW OF SYSTEMS:  All non-GI ROS negative except for anxiety  Past Medical History:  Diagnosis Date  . ADHD   . Anemia   . Anxiety   . Cough current   white, green-  . History of blood transfusion 2001   [place]  . IBS (irritable bowel syndrome)   . IUD complication (HCC) 07/2013   Mirena IUD embedded left in place  . Pneumonia 2006    Past Surgical History:  Procedure Laterality Date  . CESAREAN SECTION  x2  . LIPOMA EXCISION     on top of head-became infected-    Social History Dorothy Cushingoni M Graffius  reports that she has  never smoked. She has never used smokeless tobacco. She reports that she does not drink alcohol or use drugs.  family history is not on file.  Allergies  Allergen Reactions  . Advil [Ibuprofen]     palpitations       PHYSICAL EXAMINATION: Vital signs: BP 110/78   Pulse 64   Ht 5\' 6"  (1.676 m)   Wt 170 lb 6.4 oz (77.3 kg)   BMI 27.50 kg/m   Constitutional: generally well-appearing, no acute distress Psychiatric: alert and oriented x3, cooperative Eyes: extraocular movements intact, anicteric, conjunctiva pink Mouth: oral pharynx moist, no lesions Neck: suppleWithout thyromegaly Lymph: no lymphadenopathy Cardiovascular: heart regular rate and rhythm, no murmur Lungs: clear to auscultation bilaterally Abdomen: soft, nontender, nondistended, no obvious ascites, no peritoneal signs, normal bowel sounds, no organomegaly Rectal: Deferred until colonoscopy Extremities: no clubbing cyanosis or lower extremity edema bilaterally Skin: no lesions on visible extremities Neuro: No focal deficits. Cranial nerves intact. Normal DTRs. No asterixis.   ASSESSMENT:  #1. Chronic problems with diarrhea as described. Rule out IBS, microscopic colitis, bile salt related diarrhea, bacterial overgrowth, celiac disease #2. Isolated episode of rectal bleeding. Etiology uncertain. Needs evaluated #3. Chronic problems with increased intestinal gas as manifested principally by flatus and occasional bloating #4. Anxiety disorder  PLAN:  #1. Screening for celiac disease with tissue transglutaminase antibody and serum IgA #2. Schedule colonoscopy to evaluate chronic diarrhea and bleeding. Also provided neoplasia screening.The nature of the procedure, as well as the risks, benefits, and alternatives were carefully and thoroughly  reviewed with the patient. Ample time for discussion and questions allowed. The patient understood, was satisfied, and agreed to proceed. #3. Treatment options to follow after the  above completed depending upon the cause (Colestid, Xifaxan, viberzi, gluten elimination, etc.)   A copy of this consultation note has been sent to Dorothy LanPenny Jones NP

## 2016-06-16 LAB — TISSUE TRANSGLUTAMINASE, IGA: TISSUE TRANSGLUTAMINASE AB, IGA: 1 U/mL (ref <4)

## 2016-08-18 ENCOUNTER — Encounter: Payer: Self-pay | Admitting: Internal Medicine

## 2016-08-25 ENCOUNTER — Encounter: Payer: Self-pay | Admitting: Internal Medicine

## 2016-08-25 ENCOUNTER — Ambulatory Visit (AMBULATORY_SURGERY_CENTER): Payer: Managed Care, Other (non HMO) | Admitting: Internal Medicine

## 2016-08-25 VITALS — BP 99/66 | HR 73 | Temp 97.5°F | Resp 10 | Ht 66.0 in | Wt 170.0 lb

## 2016-08-25 DIAGNOSIS — K599 Functional intestinal disorder, unspecified: Secondary | ICD-10-CM | POA: Diagnosis not present

## 2016-08-25 DIAGNOSIS — K625 Hemorrhage of anus and rectum: Secondary | ICD-10-CM

## 2016-08-25 DIAGNOSIS — R197 Diarrhea, unspecified: Secondary | ICD-10-CM | POA: Diagnosis not present

## 2016-08-25 MED ORDER — SODIUM CHLORIDE 0.9 % IV SOLN: 500.0000 mL | INTRAVENOUS | Status: AC

## 2016-08-25 NOTE — Op Note (Signed)
[facility] Patient Name: Sonda Primesoni Troublefield Procedure Date: 08/25/2016 1:23 PM MRN: [id number] Endoscopist: Wilhemina BonitoJohn N. Marina GoodellPerry , MD Age: [age] Referring MD:  Date of Birth: 1956/07/22 Gender: Female Account #: [id number] Procedure:                Colonoscopy, with biopsies Indications:              Clinically significant diarrhea of unexplained                            origin, Rectal bleeding Medicines:                Monitored Anesthesia Care Procedure:                Pre-Anesthesia Assessment:                           - Prior to the procedure, a History and Physical                            was performed, and patient medications and                            allergies were reviewed. The patient's tolerance of                            previous anesthesia was also reviewed. The risks                            and benefits of the procedure and the sedation                            options and risks were discussed with the patient.                            All questions were answered, and informed consent                            was obtained. Prior Anticoagulants: The patient has                            taken no previous anticoagulant or antiplatelet                            agents. ASA Grade Assessment: II - A patient with                            mild systemic disease. After reviewing the risks                            and benefits, the patient was deemed in                            satisfactory condition to undergo the procedure.  After obtaining informed consent, the colonoscope                            was passed under direct vision. Throughout the                            procedure, the patient's blood pressure, pulse, and                            oxygen saturations were monitored continuously. The                            Model PCF-H190L [id number]) scope was introduced                            through the anus and advanced  to the the cecum,                            identified by appendiceal orifice and ileocecal                            valve. The ileocecal valve, appendiceal orifice,                            and rectum were photographed. The quality of the                            bowel preparation was excellent. The colonoscopy                            was performed without difficulty. The patient                            tolerated the procedure well. The bowel preparation                            used was SUPREP. Scope In: 1:23:48 PM Scope Out: 1:37:33 PM Scope Withdrawal Time: 0 hours 8 minutes 49 seconds  Total Procedure Duration: 0 hours 13 minutes 45 seconds  Findings:                 The terminal ileum appeared normal.                           A few small-mouthed diverticula were found in the                            sigmoid colon and ascending colon.                           The entire examined colon appeared otherwise normal                            on direct and retroflexion views. Biopsies for  histology were taken with a cold forceps from the                            entire colon for evaluation of microscopic colitis. Complications:            No immediate complications. Estimated blood loss:                            None. Estimated Blood Loss:     Estimated blood loss: none. Impression:               - The examined portion of the ileum was normal.                           - Diverticulosis in the sigmoid colon and in the                            ascending colon.                           - The entire examined colon is otherwise normal on                            direct and retroflexion views. Recommendation:           - Repeat colonoscopy in 10 years for screening                            purposes.                           - Okay to try Imodium for diarrhea as needed.                           - Resume previous diet.                            - Schedule follow-up office appointment with Dr.                            Marina Goodell about 4-6 weeks.                           - Await pathology results. We will send you a                            letter with the results and any further                            recommendations (if any). Wilhemina Bonito. Marina Goodell, MD 08/25/2016 1:46:35 PM This report has been signed electronically.

## 2016-08-25 NOTE — Progress Notes (Signed)
Patient awakening,vss,report to rn 

## 2016-08-25 NOTE — Patient Instructions (Signed)
YOU HAD AN ENDOSCOPIC PROCEDURE TODAY AT [facility]:   Refer to the procedure report that was given to you for any specific questions about what was found during the examination.  If the procedure report does not answer your questions, please call your gastroenterologist to clarify.  If you requested that your care partner not be given the details of your procedure findings, then the procedure report has been included in a sealed envelope for you to review at your convenience later.  YOU SHOULD EXPECT: Some feelings of bloating in the abdomen. Passage of more gas than usual.  Walking can help get rid of the air that was put into your GI tract during the procedure and reduce the bloating. If you had a lower endoscopy (such as a colonoscopy or flexible sigmoidoscopy) you may notice spotting of blood in your stool or on the toilet paper. If you underwent a bowel prep for your procedure, you may not have a normal bowel movement for a few days.  Please Note:  You might notice some irritation and congestion in your nose or some drainage.  This is from the oxygen used during your procedure.  There is no need for concern and it should clear up in a day or so.  SYMPTOMS TO REPORT IMMEDIATELY:   Following lower endoscopy (colonoscopy or flexible sigmoidoscopy):  Excessive amounts of blood in the stool  Significant tenderness or worsening of abdominal pains  Swelling of the abdomen that is new, acute  Fever of 100F or higher   Following upper endoscopy (EGD)  Vomiting of blood or coffee ground material  New chest pain or pain under the shoulder blades  Painful or persistently difficult swallowing  New shortness of breath  Fever of 100F or higher  Black, tarry-looking stools  For urgent or emergent issues, a gastroenterologist can be reached at any hour by calling [phone].   DIET:  We do recommend a small meal at first, but then you may proceed to your regular diet.  Drink  plenty of fluids but you should avoid alcoholic beverages for 24 hours.  ACTIVITY:  You should plan to take it easy for the rest of today and you should NOT DRIVE or use heavy machinery until tomorrow (because of the sedation medicines used during the test).    FOLLOW UP: Our staff will call the number listed on your records the next business day following your procedure to check on you and address any questions or concerns that you may have regarding the information given to you following your procedure. If we do not reach you, we will leave a message.  However, if you are feeling well and you are not experiencing any problems, there is no need to return our call.  We will assume that you have returned to your regular daily activities without incident.  If any biopsies were taken you will be contacted by phone or by letter within the next 1-3 weeks.  Please call us at [phone] if you have not heard about the biopsies in 3 weeks.    SIGNATURES/CONFIDENTIALITY: You and/or your care partner have signed paperwork which will be entered into your electronic medical record.  These signatures attest to the fact that that the information above on your After Visit Summary has been reviewed and is understood.  Full responsibility of the confidentiality of this discharge information lies with you and/or your care-partner.YOU HAD AN ENDOSCOPIC PROCEDURE TODAY AT [facility]:  Refer to the procedure report that was given to you for any specific questions about what was found during the examination.  If the procedure report does not answer your questions, please call your gastroenterologist to clarify.  If you requested that your care partner not be given the details of your procedure findings, then the procedure report has been included in a sealed envelope for you to review at your convenience later.  YOU SHOULD EXPECT: Some feelings of bloating in the abdomen. Passage of more gas than  usual.  Walking can help get rid of the air that was put into your GI tract during the procedure and reduce the bloating. If you had a lower endoscopy (such as a colonoscopy or flexible sigmoidoscopy) you may notice spotting of blood in your stool or on the toilet paper. If you underwent a bowel prep for your procedure, you may not have a normal bowel movement for a few days.  Please Note:  You might notice some irritation and congestion in your nose or some drainage.  This is from the oxygen used during your procedure.  There is no need for concern and it should clear up in a day or so.  SYMPTOMS TO REPORT IMMEDIATELY:   Following lower endoscopy (colonoscopy or flexible sigmoidoscopy):  Excessive amounts of blood in the stool  Significant tenderness or worsening of abdominal pains  Swelling of the abdomen that is new, acute  Fever of 100F or higher   Following upper endoscopy (EGD)  Vomiting of blood or coffee ground material  New chest pain or pain under the shoulder blades  Painful or persistently difficult swallowing  New shortness of breath  Fever of 100F or higher  Black, tarry-looking stools  For urgent or emergent issues, a gastroenterologist can be reached at any hour by calling [phone].   DIET:  We do recommend a small meal at first, but then you may proceed to your regular diet.  Drink plenty of fluids but you should avoid alcoholic beverages for 24 hours.  ACTIVITY:  You should plan to take it easy for the rest of today and you should NOT DRIVE or use heavy machinery until tomorrow (because of the sedation medicines used during the test).    FOLLOW UP: Our staff will call the number listed on your records the next business day following your procedure to check on you and address any questions or concerns that you may have regarding the information given to you following your procedure. If we do not reach you, we will leave a message.  However, if you are feeling  well and you are not experiencing any problems, there is no need to return our call.  We will assume that you have returned to your regular daily activities without incident.  If any biopsies were taken you will be contacted by phone or by letter within the next 1-3 weeks.  Please call us at [phone] if you have not heard about the biopsies in 3 weeks.    SIGNATURES/CONFIDENTIALITY: You and/or your care partner have signed paperwork which will be entered into your electronic medical record.  These signatures attest to the fact that that the information above on your After Visit Summary has been reviewed and is understood.  Full responsibility of the confidentiality of this discharge information lies with you and/or your care-partner.YOU HAD AN ENDOSCOPIC PROCEDURE TODAY AT [facility]:   Refer to the procedure report that was given to you for any  specific questions about what was found during the examination.  If the procedure report does not answer your questions, please call your gastroenterologist to clarify.  If you requested that your care partner not be given the details of your procedure findings, then the procedure report has been included in a sealed envelope for you to review at your convenience later.  YOU SHOULD EXPECT: Some feelings of bloating in the abdomen. Passage of more gas than usual.  Walking can help get rid of the air that was put into your GI tract during the procedure and reduce the bloating. If you had a lower endoscopy (such as a colonoscopy or flexible sigmoidoscopy) you may notice spotting of blood in your stool or on the toilet paper. If you underwent a bowel prep for your procedure, you may not have a normal bowel movement for a few days.  Please Note:  You might notice some irritation and congestion in your nose or some drainage.  This is from the oxygen used during your procedure.  There is no need for concern and it should clear up in a day or  so.  SYMPTOMS TO REPORT IMMEDIATELY:   Following lower endoscopy (colonoscopy or flexible sigmoidoscopy):  Excessive amounts of blood in the stool  Significant tenderness or worsening of abdominal pains  Swelling of the abdomen that is new, acute  Fever of 100F or higher   Following upper endoscopy (EGD)  Vomiting of blood or coffee ground material  New chest pain or pain under the shoulder blades  Painful or persistently difficult swallowing  New shortness of breath  Fever of 100F or higher  Black, tarry-looking stools  For urgent or emergent issues, a gastroenterologist can be reached at any hour by calling [phone].   DIET:  We do recommend a small meal at first, but then you may proceed to your regular diet.  Drink plenty of fluids but you should avoid alcoholic beverages for 24 hours.  ACTIVITY:  You should plan to take it easy for the rest of today and you should NOT DRIVE or use heavy machinery until tomorrow (because of the sedation medicines used during the test).    FOLLOW UP: Our staff will call the number listed on your records the next business day following your procedure to check on you and address any questions or concerns that you may have regarding the information given to you following your procedure. If we do not reach you, we will leave a message.  However, if you are feeling well and you are not experiencing any problems, there is no need to return our call.  We will assume that you have returned to your regular daily activities without incident.  If any biopsies were taken you will be contacted by phone or by letter within the next 1-3 weeks.  Please call us at [phone] if you have not heard about the biopsies in 3 weeks.    SIGNATURES/CONFIDENTIALITY: You and/or your care partner have signed paperwork which will be entered into your electronic medical record.  These signatures attest to the fact that that the information above on your After  Visit Summary has been reviewed and is understood.  Full responsibility of the confidentiality of this discharge information lies with you and/or your care-partner.YOU HAD AN ENDOSCOPIC PROCEDURE TODAY AT [facility]:   Refer to the procedure report that was given to you for any specific questions about what was found during the examination.  If the  procedure report does not answer your questions, please call your gastroenterologist to clarify.  If you requested that your care partner not be given the details of your procedure findings, then the procedure report has been included in a sealed envelope for you to review at your convenience later.  YOU SHOULD EXPECT: Some feelings of bloating in the abdomen. Passage of more gas than usual.  Walking can help get rid of the air that was put into your GI tract during the procedure and reduce the bloating. If you had a lower endoscopy (such as a colonoscopy or flexible sigmoidoscopy) you may notice spotting of blood in your stool or on the toilet paper. If you underwent a bowel prep for your procedure, you may not have a normal bowel movement for a few days.  Please Note:  You might notice some irritation and congestion in your nose or some drainage.  This is from the oxygen used during your procedure.  There is no need for concern and it should clear up in a day or so.  SYMPTOMS TO REPORT IMMEDIATELY:   Following lower endoscopy (colonoscopy or flexible sigmoidoscopy):  Excessive amounts of blood in the stool  Significant tenderness or worsening of abdominal pains  Swelling of the abdomen that is new, acute  Fever of 100F or higher   Following upper endoscopy (EGD)  Vomiting of blood or coffee ground material  New chest pain or pain under the shoulder blades  Painful or persistently difficult swallowing  New shortness of breath  Fever of 100F or higher  Black, tarry-looking stools  For urgent or emergent issues, a  gastroenterologist can be reached at any hour by calling [phone].   DIET:  We do recommend a small meal at first, but then you may proceed to your regular diet.  Drink plenty of fluids but you should avoid alcoholic beverages for 24 hours.  ACTIVITY:  You should plan to take it easy for the rest of today and you should NOT DRIVE or use heavy machinery until tomorrow (because of the sedation medicines used during the test).    FOLLOW UP: Our staff will call the number listed on your records the next business day following your procedure to check on you and address any questions or concerns that you may have regarding the information given to you following your procedure. If we do not reach you, we will leave a message.  However, if you are feeling well and you are not experiencing any problems, there is no need to return our call.  We will assume that you have returned to your regular daily activities without incident.  If any biopsies were taken you will be contacted by phone or by letter within the next 1-3 weeks.  Please call us at [phone] if you have not heard about the biopsies in 3 weeks.    SIGNATURES/CONFIDENTIALITY: You and/or your care partner have signed paperwork which will be entered into your electronic medical record.  These signatures attest to the fact that that the information above on your After Visit Summary has been reviewed and is understood.  Full responsibility of the confidentiality of this discharge information lies with you and/or your care-partner.  Diverticulosis information given Recall colonoscopy 10 years-2028  Schedule office appointment with Dr. Marina GoodellPerry for 4-6 weeks  Ok to try Immodium for diarrhea as needed.

## 2016-08-28 ENCOUNTER — Telehealth: Payer: Self-pay | Admitting: *Deleted

## 2016-08-28 ENCOUNTER — Telehealth: Payer: Self-pay

## 2016-08-28 NOTE — Telephone Encounter (Signed)
  Follow up Call-  Call back number 08/25/2016  Post procedure Call Back phone  # [phone]  Permission to leave phone message Yes  Some recent data might be hidden    Message left to call us if necessary.

## 2016-08-28 NOTE — Telephone Encounter (Signed)
  Follow up Call-  Call back number 08/25/2016  Post procedure Call Back phone  # [phone]  Permission to leave phone message Yes  Some recent data might be hidden    Patient was called for follow up after her procedure on 08/25/2016. No answer at the number given for follow up phone call. A message was left on the answering machine.

## 2016-08-31 ENCOUNTER — Encounter: Payer: Self-pay | Admitting: Internal Medicine

## 2016-09-01 ENCOUNTER — Encounter: Payer: Self-pay | Admitting: Internal Medicine

## 2016-09-04 ENCOUNTER — Encounter: Payer: Self-pay | Admitting: Internal Medicine

## 2016-10-16 ENCOUNTER — Ambulatory Visit: Payer: Self-pay | Admitting: Internal Medicine

## 2023-06-26 ENCOUNTER — Other Ambulatory Visit: Payer: Self-pay | Admitting: Family Medicine

## 2023-06-26 DIAGNOSIS — Z1231 Encounter for screening mammogram for malignant neoplasm of breast: Secondary | ICD-10-CM

## 2023-07-25 ENCOUNTER — Ambulatory Visit: Payer: Managed Care, Other (non HMO)
